# Patient Record
Sex: Female | Born: 1975 | Race: White | Hispanic: No | Smoking: Former smoker
Health system: Southern US, Community
[De-identification: ages and names within clinical notes are randomized; demographics above are authoritative.]

## PROBLEM LIST (undated history)

## (undated) DIAGNOSIS — G43909 Migraine, unspecified, not intractable, without status migrainosus: Secondary | ICD-10-CM

## (undated) HISTORY — PX: NO PAST SURGERIES: SHX2092

## (undated) HISTORY — DX: Migraine, unspecified, not intractable, without status migrainosus: G43.909

---

## 2009-02-20 ENCOUNTER — Emergency Department (HOSPITAL_COMMUNITY): Admission: EM | Admit: 2009-02-20 | Discharge: 2009-02-20 | Payer: Self-pay | Admitting: Emergency Medicine

## 2009-09-23 ENCOUNTER — Ambulatory Visit (HOSPITAL_COMMUNITY): Admission: RE | Admit: 2009-09-23 | Discharge: 2009-09-23 | Payer: Self-pay | Admitting: Obstetrics

## 2010-04-07 ENCOUNTER — Ambulatory Visit: Payer: Self-pay | Admitting: *Deleted

## 2010-04-21 ENCOUNTER — Ambulatory Visit: Payer: Self-pay | Admitting: *Deleted

## 2011-03-08 LAB — CBC
HCT: 42.1 % (ref 36.0–46.0)
MCHC: 33.8 g/dL (ref 30.0–36.0)
MCV: 94.3 fL (ref 78.0–100.0)
Platelets: 236 10*3/uL (ref 150–400)
WBC: 7.5 10*3/uL (ref 4.0–10.5)

## 2011-03-15 LAB — CBC
HCT: 42.6 % (ref 36.0–46.0)
Platelets: 211 10*3/uL (ref 150–400)
RDW: 12.9 % (ref 11.5–15.5)

## 2011-03-15 LAB — COMPREHENSIVE METABOLIC PANEL
AST: 18 U/L (ref 0–37)
Albumin: 3.7 g/dL (ref 3.5–5.2)
Alkaline Phosphatase: 55 U/L (ref 39–117)
BUN: 2 mg/dL — ABNORMAL LOW (ref 6–23)
Creatinine, Ser: 0.48 mg/dL (ref 0.4–1.2)
GFR calc Af Amer: >60 mL/min (ref >60)
Potassium: 3.6 mEq/L (ref 3.5–5.1)
Total Protein: 5.9 g/dL — ABNORMAL LOW (ref 6.0–8.3)

## 2011-03-15 LAB — DIFFERENTIAL
Lymphocytes Relative: 22 % (ref 12–46)
Monocytes Absolute: 0.7 10*3/uL (ref 0.1–1.0)
Monocytes Relative: 7 % (ref 3–12)
Neutro Abs: 6.4 10*3/uL (ref 1.7–7.7)

## 2011-03-15 LAB — APTT: aPTT: 30 seconds (ref 24–37)

## 2011-08-13 ENCOUNTER — Ambulatory Visit: Payer: Self-pay | Admitting: Obstetrics

## 2013-03-10 ENCOUNTER — Telehealth: Payer: Self-pay | Admitting: *Deleted

## 2013-03-10 NOTE — Telephone Encounter (Signed)
Per Dr Clearance Coots- ok to RF Ambien 10mg   #30 1 po hs- with 2 additional RF. Rx called to pharmacy.

## 2013-03-10 NOTE — Telephone Encounter (Signed)
Call from the [facility] [phone]- patient is requesting a refill on her Ambien 10 mg # 30 last refilled 02/16/13.

## 2013-04-02 ENCOUNTER — Encounter: Payer: Self-pay | Admitting: Obstetrics

## 2013-04-02 ENCOUNTER — Ambulatory Visit (INDEPENDENT_AMBULATORY_CARE_PROVIDER_SITE_OTHER): Payer: Managed Care, Other (non HMO) | Admitting: Obstetrics

## 2013-04-02 VITALS — BP 105/66 | HR 84 | Temp 97.7°F | Wt 122.0 lb

## 2013-04-02 DIAGNOSIS — Z30431 Encounter for routine checking of intrauterine contraceptive device: Secondary | ICD-10-CM

## 2013-04-02 DIAGNOSIS — G4700 Insomnia, unspecified: Secondary | ICD-10-CM

## 2013-04-02 MED ORDER — ZOLPIDEM TARTRATE 10 MG PO TABS: 5.0000 mg | ORAL_TABLET | Freq: Every evening | ORAL | Status: AC | PRN

## 2013-04-02 MED ORDER — ZOLPIDEM TARTRATE 10 MG PO TABS: 5.0000 mg | ORAL_TABLET | Freq: Every evening | ORAL | Status: DC | PRN

## 2013-04-02 NOTE — Progress Notes (Signed)
Subjective:     Danielle Fitzgerald is a 37 y.o. female here for IUD follow up.  Current complaints: none.  Personal health questionnaire reviewed: not asked.   Gynecologic History No LMP recorded. Patient is not currently having periods (Reason: IUD). Contraception: IUD    The following portions of the patient's history were reviewed and updated as appropriate: allergies, current medications, past family history, past medical history, past social history, past surgical history and problem list.  Review of Systems Pertinent items are noted in HPI.    Objective:    No exam performed today, Consult only..    Assessment:    IUD Surveillance   Plan:    Education reviewed: IUD.

## 2013-04-02 NOTE — Patient Instructions (Signed)
IUD management

## 2013-04-23 ENCOUNTER — Ambulatory Visit: Payer: Self-pay | Admitting: Obstetrics

## 2015-03-15 ENCOUNTER — Other Ambulatory Visit: Payer: Self-pay | Admitting: Occupational Medicine

## 2015-03-15 ENCOUNTER — Ambulatory Visit: Payer: Self-pay

## 2015-03-15 ENCOUNTER — Ambulatory Visit: Payer: Self-pay | Admitting: Occupational Medicine

## 2015-03-15 DIAGNOSIS — M25531 Pain in right wrist: Secondary | ICD-10-CM

## 2015-03-15 DIAGNOSIS — M79641 Pain in right hand: Secondary | ICD-10-CM

## 2015-05-27 ENCOUNTER — Other Ambulatory Visit (HOSPITAL_COMMUNITY): Payer: Self-pay | Admitting: Orthopedic Surgery

## 2015-05-31 ENCOUNTER — Other Ambulatory Visit (HOSPITAL_COMMUNITY): Payer: Self-pay | Admitting: Orthopedic Surgery

## 2015-05-31 DIAGNOSIS — M25531 Pain in right wrist: Secondary | ICD-10-CM

## 2015-06-09 ENCOUNTER — Encounter (HOSPITAL_COMMUNITY)
Admission: RE | Admit: 2015-06-09 | Discharge: 2015-06-09 | Disposition: A | Discharge status: Home / Self Care | Payer: Worker's Compensation | Source: Ambulatory Visit | Attending: Orthopedic Surgery | Admitting: Orthopedic Surgery

## 2015-06-09 ENCOUNTER — Ambulatory Visit (HOSPITAL_COMMUNITY)
Admission: RE | Admit: 2015-06-09 | Discharge: 2015-06-09 | Disposition: A | Discharge status: Home / Self Care | Payer: Self-pay | Source: Ambulatory Visit | Attending: Orthopedic Surgery | Admitting: Orthopedic Surgery

## 2015-06-09 DIAGNOSIS — M25531 Pain in right wrist: Secondary | ICD-10-CM | POA: Insufficient documentation

## 2015-06-09 MED ORDER — TECHNETIUM TC 99M MEDRONATE IV KIT
25.0000 | PACK | Freq: Once | INTRAVENOUS | Status: AC | PRN
Start: 2015-06-09 — End: 2015-06-09
  Administered 2015-06-09: 27 via INTRAVENOUS

## 2015-06-14 ENCOUNTER — Ambulatory Visit (INDEPENDENT_AMBULATORY_CARE_PROVIDER_SITE_OTHER): Payer: BLUE CROSS/BLUE SHIELD | Admitting: Obstetrics

## 2015-06-14 ENCOUNTER — Encounter: Payer: Self-pay | Admitting: Obstetrics

## 2015-06-14 VITALS — BP 109/69 | HR 87 | Temp 97.0°F | Ht 65.0 in | Wt 136.8 lb

## 2015-06-14 DIAGNOSIS — Z124 Encounter for screening for malignant neoplasm of cervix: Secondary | ICD-10-CM

## 2015-06-14 DIAGNOSIS — Z30430 Encounter for insertion of intrauterine contraceptive device: Secondary | ICD-10-CM

## 2015-06-14 DIAGNOSIS — Z01419 Encounter for gynecological examination (general) (routine) without abnormal findings: Secondary | ICD-10-CM

## 2015-06-14 NOTE — Addendum Note (Signed)
Addended by: Clearnce HastenJOHNSON, LATIA on: 06/14/2015 02:32 PM   Modules accepted: Medications

## 2015-06-14 NOTE — Progress Notes (Signed)
Subjective:        Danielle Fitzgerald is a 39 y.o. female here for a routine exam.  Current complaints: none.    Personal health questionnaire:  Is patient Danielle Fitzgerald, have a family history of breast and/or ovarian cancer: no Is there a family history of uterine cancer diagnosed at age < 1550, gastrointestinal cancer, urinary tract cancer, family member who is a Personnel officerLynch syndrome-associated carrier: no Is the patient overweight and hypertensive, family history of diabetes, personal history of gestational diabetes, preeclampsia or PCOS: no Is patient over 1455, have PCOS,  family history of premature CHD under age 165, diabetes, smoke, have hypertension or peripheral artery disease:  no At any time, has a partner hit, kicked or otherwise hurt or frightened you?: no Over the past 2 weeks, have you felt down, depressed or hopeless?: no Over the past 2 weeks, have you felt little interest or pleasure in doing things?:no   Gynecologic History Patient's last menstrual period was 06/08/2015 (approximate). Contraception: IUD Last Pap: 2015. Results were: normal Last mammogram: n/a. Results were: n/a  Obstetric History OB History  No data available    Past Medical History  Diagnosis Date  . Migraine     Past Surgical History  Procedure Laterality Date  . No past surgeries       Current outpatient prescriptions:  .  levonorgestrel (MIRENA) 20 MCG/24HR IUD, 1 each by Intrauterine route once., Disp: , Rfl:  .  topiramate (TOPAMAX) 100 MG tablet, Take 100 mg by mouth 2 (two) times daily., Disp: , Rfl:  .  topiramate (TOPAMAX) 25 MG tablet, Take 25 mg by mouth every morning., Disp: , Rfl:  No Known Allergies  History  Substance Use Topics  . Smoking status: Current Every Day Smoker -- 0.25 packs/day    Types: Cigarettes  . Smokeless tobacco: Former NeurosurgeonUser  . Alcohol Use: No    History reviewed. No pertinent family history.    Review of Systems  Constitutional: negative for  fatigue and weight loss Respiratory: negative for cough and wheezing Cardiovascular: negative for chest pain, fatigue and palpitations Gastrointestinal: negative for abdominal pain and change in bowel habits Musculoskeletal:negative for myalgias Neurological: negative for gait problems and tremors Behavioral/Psych: negative for abusive relationship, depression Endocrine: negative for temperature intolerance   Genitourinary:negative for abnormal menstrual periods, genital lesions, hot flashes, sexual problems and vaginal discharge Integument/breast: negative for breast lump, breast tenderness, nipple discharge and skin lesion(s)    Objective:       BP 109/69 mmHg  Pulse 87  Temp(Src) 97 F (36.1 C)  Ht 5\' 5"  (1.651 m)  Wt 136 lb 12.8 oz (62.052 kg)  BMI 22.76 kg/m2  LMP 06/08/2015 (Approximate) General:   alert  Skin:   no rash or abnormalities  Lungs:   clear to auscultation bilaterally  Heart:   regular rate and rhythm, S1, S2 normal, no murmur, click, rub or gallop  Breasts:   normal without suspicious masses, skin or nipple changes or axillary nodes  Abdomen:  normal findings: no organomegaly, soft, non-tender and no hernia  Pelvis:  External genitalia: normal general appearance Urinary system: urethral meatus normal and bladder without fullness, nontender Vaginal: normal without tenderness, induration or masses Cervix: normal appearance.  IUD string visible, normal length. Adnexa: normal bimanual exam Uterus: anteverted and non-tender, normal size   Lab Review Urine pregnancy test Labs reviewed yes Radiologic studies reviewed no    Assessment:    Healthy female exam.    Contraceptive  surveillance.  Pleased with IUD.   Plan:    Education reviewed: calcium supplements, depression evaluation, low fat, low cholesterol diet, safe sex/STD prevention, self breast exams, smoking cessation and weight bearing exercise. Contraception: IUD. Follow up in: 1 year.   No  orders of the defined types were placed in this encounter.   Orders Placed This Encounter  Procedures  . SureSwab, Vaginosis/Vaginitis Plus

## 2015-06-16 LAB — PAP IG AND HPV HIGH-RISK: HPV DNA High Risk: NOT DETECTED

## 2015-06-17 LAB — SURESWAB, VAGINOSIS/VAGINITIS PLUS
ATOPOBIUM VAGINAE: 6.6 Log (cells/mL)
C. GLABRATA, DNA: NOT DETECTED
C. PARAPSILOSIS, DNA: NOT DETECTED
C. albicans, DNA: NOT DETECTED
C. trachomatis RNA, TMA: NOT DETECTED
C. tropicalis, DNA: NOT DETECTED
GARDNERELLA VAGINALIS: 7.7 Log (cells/mL)
LACTOBACILLUS SPECIES: NOT DETECTED Log (cells/mL)
MEGASPHAERA SPECIES: 7.8 Log (cells/mL)
N. GONORRHOEAE RNA, TMA: NOT DETECTED
T. vaginalis RNA, QL TMA: NOT DETECTED

## 2015-06-21 ENCOUNTER — Other Ambulatory Visit: Payer: Self-pay | Admitting: Obstetrics

## 2015-06-21 DIAGNOSIS — N760 Acute vaginitis: Principal | ICD-10-CM

## 2015-06-21 DIAGNOSIS — B9689 Other specified bacterial agents as the cause of diseases classified elsewhere: Secondary | ICD-10-CM

## 2015-06-21 MED ORDER — TINIDAZOLE 500 MG PO TABS: 1000.0000 mg | ORAL_TABLET | Freq: Every day | ORAL | Status: DC

## 2016-03-28 DIAGNOSIS — G43011 Migraine without aura, intractable, with status migrainosus: Secondary | ICD-10-CM | POA: Insufficient documentation

## 2016-03-28 DIAGNOSIS — R519 Headache, unspecified: Secondary | ICD-10-CM | POA: Insufficient documentation

## 2016-03-28 DIAGNOSIS — M791 Myalgia, unspecified site: Secondary | ICD-10-CM | POA: Insufficient documentation

## 2017-02-07 ENCOUNTER — Other Ambulatory Visit: Payer: Self-pay | Admitting: Family Medicine

## 2017-02-07 ENCOUNTER — Ambulatory Visit
Admission: RE | Admit: 2017-02-07 | Discharge: 2017-02-07 | Disposition: A | Discharge status: Home / Self Care | Payer: Worker's Compensation | Source: Ambulatory Visit | Attending: Family Medicine | Admitting: Family Medicine

## 2017-02-07 DIAGNOSIS — M79641 Pain in right hand: Secondary | ICD-10-CM

## 2020-06-07 ENCOUNTER — Other Ambulatory Visit: Payer: Self-pay

## 2020-06-07 ENCOUNTER — Ambulatory Visit (INDEPENDENT_AMBULATORY_CARE_PROVIDER_SITE_OTHER): Payer: 59 | Admitting: Obstetrics

## 2020-06-07 ENCOUNTER — Other Ambulatory Visit (HOSPITAL_COMMUNITY)
Admission: RE | Admit: 2020-06-07 | Discharge: 2020-06-07 | Disposition: A | Discharge status: Home / Self Care | Payer: 59 | Source: Ambulatory Visit | Attending: Obstetrics | Admitting: Obstetrics

## 2020-06-07 ENCOUNTER — Encounter: Payer: Self-pay | Admitting: Obstetrics

## 2020-06-07 VITALS — BP 112/69 | HR 83 | Ht 66.0 in | Wt 121.0 lb

## 2020-06-07 DIAGNOSIS — F17200 Nicotine dependence, unspecified, uncomplicated: Secondary | ICD-10-CM

## 2020-06-07 DIAGNOSIS — Z01419 Encounter for gynecological examination (general) (routine) without abnormal findings: Secondary | ICD-10-CM

## 2020-06-07 DIAGNOSIS — N6315 Unspecified lump in the right breast, overlapping quadrants: Secondary | ICD-10-CM

## 2020-06-07 DIAGNOSIS — N898 Other specified noninflammatory disorders of vagina: Secondary | ICD-10-CM

## 2020-06-07 DIAGNOSIS — Z1239 Encounter for other screening for malignant neoplasm of breast: Secondary | ICD-10-CM

## 2020-06-07 NOTE — Progress Notes (Signed)
  Pt has right side breast lump and right side pelvic bump.  Pt needs mammogram.  Pt has IUD in place- believes she has had for 7 years.  Would like to continue to use IUD for cycle control.

## 2020-06-07 NOTE — Progress Notes (Signed)
Subjective:        Danielle Fitzgerald is a 44 y.o. female here for a routine exam.  Current complaints: Lump in right breast.    Personal health questionnaire:  Is patient [name], have a family history of breast and/or ovarian cancer: no Is there a family history of uterine cancer diagnosed at age < 32, gastrointestinal cancer, urinary tract cancer, family member who is a Personnel officer syndrome-associated carrier: no Is the patient overweight and hypertensive, family history of diabetes, personal history of gestational diabetes, preeclampsia or PCOS: no Is patient over 45, have PCOS,  family history of premature CHD under age 99, diabetes, smoke, have hypertension or peripheral artery disease:  no At any time, has a partner hit, kicked or otherwise hurt or frightened you?: no Over the past 2 weeks, have you felt down, depressed or hopeless?: no Over the past 2 weeks, have you felt little interest or pleasure in doing things?:no   Gynecologic History No LMP recorded. (Menstrual status: IUD). Contraception: IUD Last Pap: 2016. Results were: normal Last mammogram: 2017. Results were: normal  Obstetric History OB History  No obstetric history on file.    Past Medical History:  Diagnosis Date   Migraine     Past Surgical History:  Procedure Laterality Date   NO PAST SURGERIES       Current Outpatient Medications:    naratriptan (AMERGE) 2.5 MG tablet, Take 2.5 mg by mouth as needed for migraine. Take one (1) tablet at onset of headache; if returns or does not resolve, may repeat after 4 hours; do not exceed five (5) mg in 24 hours., Disp: , Rfl:    zonisamide (ZONEGRAN) 25 MG capsule, Take 25 mg by mouth daily., Disp: , Rfl:    levonorgestrel (MIRENA) 20 MCG/24HR IUD, 1 each by Intrauterine route once., Disp: , Rfl:    meloxicam (MOBIC) 15 MG tablet, Take 15 mg by mouth daily., Disp: , Rfl:  No Known Allergies  Social History   Tobacco Use   Smoking status: Current  Every Day Smoker    Packs/day: 0.25    Types: Cigarettes   Smokeless tobacco: Former Neurosurgeon   Tobacco comment: 2 cigs/ day  Substance Use Topics   Alcohol use: No    History reviewed. No pertinent family history.    Review of Systems  Constitutional: negative for fatigue and weight loss Respiratory: negative for cough and wheezing Cardiovascular: negative for chest pain, fatigue and palpitations Gastrointestinal: negative for abdominal pain and change in bowel habits Musculoskeletal:negative for myalgias Neurological: negative for gait problems and tremors Behavioral/Psych: negative for abusive relationship, depression Endocrine: negative for temperature intolerance    Genitourinary:negative for abnormal menstrual periods, genital lesions, hot flashes, sexual problems and vaginal discharge Integument/breast: negative for breast lump, breast tenderness, nipple discharge and skin lesion(s)    Objective:       BP 112/69    Pulse 83    Ht 5\' 6"  (1.676 m)    Wt 121 lb (54.9 kg)    BMI 19.53 kg/m  General:   alert  Skin:   no rash or abnormalities  Lungs:   clear to auscultation bilaterally  Heart:   regular rate and rhythm, S1, S2 normal, no murmur, click, rub or gallop  Breasts:   normal without suspicious masses, skin or nipple changes or axillary nodes  Abdomen:  normal findings: no organomegaly, soft, non-tender and no hernia  Pelvis:  External genitalia: normal general appearance Urinary system: urethral meatus normal  and bladder without fullness, nontender Vaginal: normal without tenderness, induration or masses Cervix: normal appearance Adnexa: normal bimanual exam Uterus: anteverted and non-tender, normal size   Lab Review Urine pregnancy test Labs reviewed yes Radiologic studies reviewed yes  50% of 20 min visit spent on counseling and coordination of care.   Assessment:     1. Encounter for routine gynecological examination with Papanicolaou smear of  cervix Rx: - Cytology - PAP( [place])  2. Vaginal discharge Rx: - Cervicovaginal ancillary only( [place])  3. Breast lump on right side at 9 o'clock position Rx: - [facility] DIAG BREAST TOMO BILATERAL; Future  4. Screening breast examination Rx: - [facility] DIGITAL SCREENING BILATERAL; Future  5. Tobacco dependence - cessation with the aid of medication and behavioral modification recommended   Plan:    Education reviewed: calcium supplements, depression evaluation, low fat, low cholesterol diet, safe sex/STD prevention, self breast exams, smoking cessation and weight bearing exercise. Contraception: IUD. Mammogram ordered. Follow up in: 2 weeks.   No orders of the defined types were placed in this encounter.  Orders Placed This Encounter  Procedures   MM DIAG BREAST TOMO BILATERAL    Standing Status:   Future    Standing Expiration Date:   06/07/2021    Order Specific Question:   Reason for Exam (SYMPTOM  OR DIAGNOSIS REQUIRED)    Answer:   Breast mass - Left breast    Order Specific Question:   Is the patient pregnant?    Answer:   No    Order Specific Question:   Preferred imaging location?    Answer:   [facility]   MM DIGITAL SCREENING BILATERAL    Standing Status:   Future    Standing Expiration Date:   06/07/2021    Order Specific Question:   Reason for Exam (SYMPTOM  OR DIAGNOSIS REQUIRED)    Answer:   Screening.    Order Specific Question:   Is the patient pregnant?    Answer:   No    Order Specific Question:   Preferred imaging location?    Answer:   [facility]     Brock Bad, MD 06/07/2020 2:47 PM

## 2020-06-08 LAB — CYTOLOGY - PAP
Comment: NEGATIVE
Diagnosis: NEGATIVE
High risk HPV: NEGATIVE

## 2020-06-09 ENCOUNTER — Other Ambulatory Visit: Payer: Self-pay | Admitting: Obstetrics

## 2020-06-09 DIAGNOSIS — B9689 Other specified bacterial agents as the cause of diseases classified elsewhere: Secondary | ICD-10-CM

## 2020-06-09 LAB — CERVICOVAGINAL ANCILLARY ONLY
Bacterial Vaginitis (gardnerella): POSITIVE — AB
Candida Glabrata: NEGATIVE
Candida Vaginitis: NEGATIVE
Chlamydia: NEGATIVE
Comment: NEGATIVE
Comment: NEGATIVE
Comment: NEGATIVE
Comment: NEGATIVE
Comment: NEGATIVE
Comment: NORMAL
Neisseria Gonorrhea: NEGATIVE
Trichomonas: NEGATIVE

## 2020-06-09 MED ORDER — METRONIDAZOLE 500 MG PO TABS: 500.0000 mg | ORAL_TABLET | Freq: Two times a day (BID) | ORAL | 2 refills | Status: AC

## 2020-06-10 ENCOUNTER — Telehealth: Payer: Self-pay

## 2020-06-10 NOTE — Telephone Encounter (Signed)
Called pt to advise of results and rx, no answer, left vm

## 2020-06-13 ENCOUNTER — Other Ambulatory Visit: Payer: Self-pay | Admitting: Obstetrics

## 2020-06-13 DIAGNOSIS — N6315 Unspecified lump in the right breast, overlapping quadrants: Secondary | ICD-10-CM

## 2020-06-21 ENCOUNTER — Other Ambulatory Visit: Payer: Self-pay

## 2020-06-21 ENCOUNTER — Ambulatory Visit (INDEPENDENT_AMBULATORY_CARE_PROVIDER_SITE_OTHER): Payer: 59 | Admitting: Obstetrics

## 2020-06-21 ENCOUNTER — Encounter: Payer: Self-pay | Admitting: Obstetrics

## 2020-06-21 VITALS — BP 119/70 | HR 87 | Wt 119.0 lb

## 2020-06-21 DIAGNOSIS — F17200 Nicotine dependence, unspecified, uncomplicated: Secondary | ICD-10-CM | POA: Diagnosis not present

## 2020-06-21 DIAGNOSIS — Z3009 Encounter for other general counseling and advice on contraception: Secondary | ICD-10-CM

## 2020-06-21 DIAGNOSIS — G43711 Chronic migraine without aura, intractable, with status migrainosus: Secondary | ICD-10-CM

## 2020-06-21 LAB — POCT URINE PREGNANCY: Preg Test, Ur: NEGATIVE

## 2020-06-21 NOTE — Progress Notes (Signed)
Subjective:    Jonnae Fonseca is a 44 y.o. female who presents for contraception counseling. The patient has no complaints today. The patient is sexually active. Pertinent past medical history: current smoker and migraines.  The information documented in the HPI was reviewed and verified.  Menstrual History: OB History   No obstetric history on file.      Patient's last menstrual period was 06/06/2020.   There are no problems to display for this patient.  Past Medical History:  Diagnosis Date  . Migraine     Past Surgical History:  Procedure Laterality Date  . NO PAST SURGERIES       Current Outpatient Medications:  .  meloxicam (MOBIC) 15 MG tablet, Take 15 mg by mouth daily., Disp: , Rfl:  .  metroNIDAZOLE (FLAGYL) 500 MG tablet, Take 1 tablet (500 mg total) by mouth 2 (two) times daily., Disp: 14 tablet, Rfl: 2 .  naratriptan (AMERGE) 2.5 MG tablet, Take 2.5 mg by mouth as needed for migraine. Take one (1) tablet at onset of headache; if returns or does not resolve, may repeat after 4 hours; do not exceed five (5) mg in 24 hours., Disp: , Rfl:  .  zonisamide (ZONEGRAN) 25 MG capsule, Take 25 mg by mouth daily., Disp: , Rfl:  No Known Allergies  Social History   Tobacco Use  . Smoking status: Current Every Day Smoker    Packs/day: 0.25    Types: Cigarettes  . Smokeless tobacco: Former Neurosurgeon  . Tobacco comment: 2 cigs/ day  Substance Use Topics  . Alcohol use: No    No family history on file.     Review of Systems Constitutional: negative for weight loss Genitourinary:negative for abnormal menstrual periods and vaginal discharge   Objective:   BP 119/70   Pulse 87   Wt 119 lb (54 kg)   LMP 06/06/2020   BMI 19.21 kg/m   PE:  Deferred   Lab Review Urine pregnancy test Labs reviewed yes Radiologic studies reviewed yes  50% of 15 min visit spent on counseling and coordination of care.    Assessment:    44 y.o., discontinuing IUD, contraindications  tobacco dependence and migraines.   Plan:    All questions answered. Discussed healthy lifestyle modifications. Agricultural engineer distributed. Follow up as needed. Considering Endometrial Ablation.  Will remove IUD at the time of Ablation.  Orders Placed This Encounter  Procedures  . POCT urine pregnancy    Brock Bad, MD 06/21/2020 2:37 PM

## 2020-06-21 NOTE — Progress Notes (Signed)
RGYN pt present for IUD removal re-insertion. Pt states current IUD as been in 7 yrs pt has Mirena,

## 2020-07-01 ENCOUNTER — Other Ambulatory Visit: Payer: Self-pay

## 2020-07-01 ENCOUNTER — Other Ambulatory Visit: Payer: Self-pay | Admitting: Obstetrics

## 2020-07-01 ENCOUNTER — Ambulatory Visit
Admission: RE | Admit: 2020-07-01 | Discharge: 2020-07-01 | Disposition: A | Discharge status: Home / Self Care | Payer: 59 | Source: Ambulatory Visit | Attending: Obstetrics | Admitting: Obstetrics

## 2020-07-01 DIAGNOSIS — N6315 Unspecified lump in the right breast, overlapping quadrants: Secondary | ICD-10-CM

## 2020-07-07 ENCOUNTER — Other Ambulatory Visit: Payer: Self-pay

## 2020-07-07 ENCOUNTER — Encounter: Payer: Self-pay | Admitting: Obstetrics & Gynecology

## 2020-07-07 ENCOUNTER — Ambulatory Visit (INDEPENDENT_AMBULATORY_CARE_PROVIDER_SITE_OTHER): Payer: 59 | Admitting: Obstetrics & Gynecology

## 2020-07-07 ENCOUNTER — Institutional Professional Consult (permissible substitution): Payer: 59 | Admitting: Obstetrics & Gynecology

## 2020-07-07 VITALS — BP 119/77 | HR 74 | Wt 118.3 lb

## 2020-07-07 DIAGNOSIS — Z30433 Encounter for removal and reinsertion of intrauterine contraceptive device: Secondary | ICD-10-CM | POA: Diagnosis not present

## 2020-07-07 DIAGNOSIS — Z30430 Encounter for insertion of intrauterine contraceptive device: Secondary | ICD-10-CM

## 2020-07-07 MED ORDER — LEVONORGESTREL 20 MCG/24HR IU IUD: INTRAUTERINE_SYSTEM | Freq: Once | INTRAUTERINE | Status: AC

## 2020-07-07 NOTE — Progress Notes (Signed)
Pt is here for consult. Pt reports that she is considering an ablation due to having heavy menstrual cycles for years. Pt reports she currently has a Mirena that is due to be removed, she reports the Mirena IUD has been helping with the bleeding.     GYNECOLOGY OFFICE PROCEDURE NOTE  Danielle Fitzgerald is a 44 y.o. No obstetric history on file. here for  Mirena replacement No GYN concerns.  Last pap smear was on 06/21/20 and was normal.We discussed that the IUD is not contraindicated and she wishes to proceed   GYNECOLOGY OFFICE PROCEDURE NOTE  Danielle Fitzgerald is a 44 y.o. No obstetric history on file. here for  IUD removal. No GYN concerns.  IUD Removal  Patient identified, informed consent performed, consent signed.  Patient was in the dorsal lithotomy position, normal external genitalia was noted.  A speculum was placed in the patient's vagina, normal discharge was noted, no lesions. The cervix was visualized, no lesions, no abnormal discharge.  The strings of the IUD were grasped and pulled using ring forceps. The IUD was removed in its entirety. .  Patient tolerated the procedure well.    Patient will use Mirena for contraception  IUD Insertion Procedure Note Patient identified, informed consent performed, consent signed.   Discussed risks of irregular bleeding, cramping, infection, malpositioning or misplacement of the IUD outside the uterus which may require further procedure such as laparoscopy. Time out was performed.  Urine pregnancy test negative.  Speculum placed in the vagina.  Cervix visualized.  Cleaned with Betadine x 2.  Grasped anteriorly with a single tooth tenaculum.  Uterus sounded to 8 cm.  Mirena IUD placed per manufacturer's recommendations.  Strings trimmed to 3 cm. Tenaculum was removed, good hemostasis noted.  Patient tolerated procedure well.   Patient was given post-procedure instructions.  Patient was also asked to check IUD strings periodically and follow up in 4  weeks for IUD check.   Scheryl Darter, MD, FACOG Attending Obstetrician & Gynecologist, [facility] for [facility], [facility]

## 2020-07-07 NOTE — Patient Instructions (Signed)
Levonorgestrel intrauterine device (IUD) What is this medicine? LEVONORGESTREL IUD (LEE voe nor jes trel) is a contraceptive (birth control) device. The device is placed inside the uterus by a healthcare professional. It is used to prevent pregnancy. This device can also be used to treat heavy bleeding that occurs during your period. This medicine may be used for other purposes; ask your health care provider or pharmacist if you have questions. COMMON BRAND NAME(S): Kyleena, LILETTA, Mirena, Skyla What should I tell my health care provider before I take this medicine? They need to know if you have any of these conditions:  abnormal Pap smear  cancer of the breast, uterus, or cervix  diabetes  endometritis  genital or pelvic infection now or in the past  have more than one sexual partner or your partner has more than one partner  heart disease  history of an ectopic or tubal pregnancy  immune system problems  IUD in place  liver disease or tumor  problems with blood clots or take blood-thinners  seizures  use intravenous drugs  uterus of unusual shape  vaginal bleeding that has not been explained  an unusual or allergic reaction to levonorgestrel, other hormones, silicone, or polyethylene, medicines, foods, dyes, or preservatives  pregnant or trying to get pregnant  breast-feeding How should I use this medicine? This device is placed inside the uterus by a health care professional. Talk to your pediatrician regarding the use of this medicine in children. Special care may be needed. Overdosage: If you think you have taken too much of this medicine contact a poison control center or emergency room at once. NOTE: This medicine is only for you. Do not share this medicine with others. What if I miss a dose? This does not apply. Depending on the brand of device you have inserted, the device will need to be replaced every 3 to 6 years if you wish to continue using this type  of birth control. What may interact with this medicine? Do not take this medicine with any of the following medications:  amprenavir  bosentan  fosamprenavir This medicine may also interact with the following medications:  aprepitant  armodafinil  barbiturate medicines for inducing sleep or treating seizures  bexarotene  boceprevir  griseofulvin  medicines to treat seizures like carbamazepine, ethotoin, felbamate, oxcarbazepine, phenytoin, topiramate  modafinil  pioglitazone  rifabutin  rifampin  rifapentine  some medicines to treat HIV infection like atazanavir, efavirenz, indinavir, lopinavir, nelfinavir, tipranavir, ritonavir  St. John's wort  warfarin This list may not describe all possible interactions. Give your health care provider a list of all the medicines, herbs, non-prescription drugs, or dietary supplements you use. Also tell them if you smoke, drink alcohol, or use illegal drugs. Some items may interact with your medicine. What should I watch for while using this medicine? Visit your doctor or health care professional for regular check ups. See your doctor if you or your partner has sexual contact with others, becomes HIV positive, or gets a sexual transmitted disease. This product does not protect you against HIV infection (AIDS) or other sexually transmitted diseases. You can check the placement of the IUD yourself by reaching up to the top of your vagina with clean fingers to feel the threads. Do not pull on the threads. It is a good habit to check placement after each menstrual period. Call your doctor right away if you feel more of the IUD than just the threads or if you cannot feel the threads at   all. The IUD may come out by itself. You may become pregnant if the device comes out. If you notice that the IUD has come out use a backup birth control method like condoms and call your health care provider. Using tampons will not change the position of the  IUD and are okay to use during your period. This IUD can be safely scanned with magnetic resonance imaging (MRI) only under specific conditions. Before you have an MRI, tell your healthcare provider that you have an IUD in place, and which type of IUD you have in place. What side effects may I notice from receiving this medicine? Side effects that you should report to your doctor or health care professional as soon as possible:  allergic reactions like skin rash, itching or hives, swelling of the face, lips, or tongue  fever, flu-like symptoms  genital sores  high blood pressure  no menstrual period for 6 weeks during use  pain, swelling, warmth in the leg  pelvic pain or tenderness  severe or sudden headache  signs of pregnancy  stomach cramping  sudden shortness of breath  trouble with balance, talking, or walking  unusual vaginal bleeding, discharge  yellowing of the eyes or skin Side effects that usually do not require medical attention (report to your doctor or health care professional if they continue or are bothersome):  acne  breast pain  change in sex drive or performance  changes in weight  cramping, dizziness, or faintness while the device is being inserted  headache  irregular menstrual bleeding within first 3 to 6 months of use  nausea This list may not describe all possible side effects. Call your doctor for medical advice about side effects. You may report side effects to FDA at 1-800-FDA-1088. Where should I keep my medicine? This does not apply. NOTE: This sheet is a summary. It may not cover all possible information. If you have questions about this medicine, talk to your doctor, pharmacist, or health care provider.  2020 Elsevier/Gold Standard (2018-09-30 13:22:01)  

## 2020-08-22 ENCOUNTER — Ambulatory Visit (INDEPENDENT_AMBULATORY_CARE_PROVIDER_SITE_OTHER): Payer: 59 | Admitting: Obstetrics & Gynecology

## 2020-08-22 ENCOUNTER — Encounter: Payer: Self-pay | Admitting: Obstetrics & Gynecology

## 2020-08-22 ENCOUNTER — Other Ambulatory Visit: Payer: Self-pay

## 2020-08-22 VITALS — BP 121/78 | HR 82 | Wt 125.0 lb

## 2020-08-22 DIAGNOSIS — Z975 Presence of (intrauterine) contraceptive device: Secondary | ICD-10-CM | POA: Diagnosis not present

## 2020-08-22 NOTE — Progress Notes (Signed)
RGYN patient presents for IUD String check.  Mirena IUD Inserted: 07/07/20  CC: NONE   No LMP recorded. (Menstrual status: IUD). Only bled for one day last menses and she is very satified  Pelvic exam: normal external genitalia, vulva, vagina, cervix, uterus and adnexa. Sting is only palpable at the os o/w nl exam  IUD in place and doing well  RTC for routine gyn care  Adam Phenix, MD 08/22/2020

## 2020-08-22 NOTE — Patient Instructions (Signed)
Levonorgestrel intrauterine device (IUD) What is this medicine? LEVONORGESTREL IUD (LEE voe nor jes trel) is a contraceptive (birth control) device. The device is placed inside the uterus by a healthcare professional. It is used to prevent pregnancy. This device can also be used to treat heavy bleeding that occurs during your period. This medicine may be used for other purposes; ask your health care provider or pharmacist if you have questions. COMMON BRAND NAME(S): Kyleena, LILETTA, Mirena, Skyla What should I tell my health care provider before I take this medicine? They need to know if you have any of these conditions:  abnormal Pap smear  cancer of the breast, uterus, or cervix  diabetes  endometritis  genital or pelvic infection now or in the past  have more than one sexual partner or your partner has more than one partner  heart disease  history of an ectopic or tubal pregnancy  immune system problems  IUD in place  liver disease or tumor  problems with blood clots or take blood-thinners  seizures  use intravenous drugs  uterus of unusual shape  vaginal bleeding that has not been explained  an unusual or allergic reaction to levonorgestrel, other hormones, silicone, or polyethylene, medicines, foods, dyes, or preservatives  pregnant or trying to get pregnant  breast-feeding How should I use this medicine? This device is placed inside the uterus by a health care professional. Talk to your pediatrician regarding the use of this medicine in children. Special care may be needed. Overdosage: If you think you have taken too much of this medicine contact a poison control center or emergency room at once. NOTE: This medicine is only for you. Do not share this medicine with others. What if I miss a dose? This does not apply. Depending on the brand of device you have inserted, the device will need to be replaced every 3 to 6 years if you wish to continue using this type  of birth control. What may interact with this medicine? Do not take this medicine with any of the following medications:  amprenavir  bosentan  fosamprenavir This medicine may also interact with the following medications:  aprepitant  armodafinil  barbiturate medicines for inducing sleep or treating seizures  bexarotene  boceprevir  griseofulvin  medicines to treat seizures like carbamazepine, ethotoin, felbamate, oxcarbazepine, phenytoin, topiramate  modafinil  pioglitazone  rifabutin  rifampin  rifapentine  some medicines to treat HIV infection like atazanavir, efavirenz, indinavir, lopinavir, nelfinavir, tipranavir, ritonavir  St. John's wort  warfarin This list may not describe all possible interactions. Give your health care provider a list of all the medicines, herbs, non-prescription drugs, or dietary supplements you use. Also tell them if you smoke, drink alcohol, or use illegal drugs. Some items may interact with your medicine. What should I watch for while using this medicine? Visit your doctor or health care professional for regular check ups. See your doctor if you or your partner has sexual contact with others, becomes HIV positive, or gets a sexual transmitted disease. This product does not protect you against HIV infection (AIDS) or other sexually transmitted diseases. You can check the placement of the IUD yourself by reaching up to the top of your vagina with clean fingers to feel the threads. Do not pull on the threads. It is a good habit to check placement after each menstrual period. Call your doctor right away if you feel more of the IUD than just the threads or if you cannot feel the threads at   all. The IUD may come out by itself. You may become pregnant if the device comes out. If you notice that the IUD has come out use a backup birth control method like condoms and call your health care provider. Using tampons will not change the position of the  IUD and are okay to use during your period. This IUD can be safely scanned with magnetic resonance imaging (MRI) only under specific conditions. Before you have an MRI, tell your healthcare provider that you have an IUD in place, and which type of IUD you have in place. What side effects may I notice from receiving this medicine? Side effects that you should report to your doctor or health care professional as soon as possible:  allergic reactions like skin rash, itching or hives, swelling of the face, lips, or tongue  fever, flu-like symptoms  genital sores  high blood pressure  no menstrual period for 6 weeks during use  pain, swelling, warmth in the leg  pelvic pain or tenderness  severe or sudden headache  signs of pregnancy  stomach cramping  sudden shortness of breath  trouble with balance, talking, or walking  unusual vaginal bleeding, discharge  yellowing of the eyes or skin Side effects that usually do not require medical attention (report to your doctor or health care professional if they continue or are bothersome):  acne  breast pain  change in sex drive or performance  changes in weight  cramping, dizziness, or faintness while the device is being inserted  headache  irregular menstrual bleeding within first 3 to 6 months of use  nausea This list may not describe all possible side effects. Call your doctor for medical advice about side effects. You may report side effects to FDA at 1-800-FDA-1088. Where should I keep my medicine? This does not apply. NOTE: This sheet is a summary. It may not cover all possible information. If you have questions about this medicine, talk to your doctor, pharmacist, or health care provider.  2020 Elsevier/Gold Standard (2018-09-30 13:22:01)  

## 2020-10-17 ENCOUNTER — Other Ambulatory Visit: Payer: Self-pay | Admitting: Occupational Medicine

## 2020-10-17 ENCOUNTER — Other Ambulatory Visit: Payer: Self-pay

## 2020-10-17 ENCOUNTER — Ambulatory Visit: Payer: Self-pay

## 2020-10-17 DIAGNOSIS — M79674 Pain in right toe(s): Secondary | ICD-10-CM

## 2021-05-05 IMAGING — MG DIGITAL DIAGNOSTIC BILAT W/ TOMO W/ CAD
8 of 14 series · 8 of 40 positions shown · non-contrast
Comparison: Previous exam(s).

CLINICAL DATA: Lump palpated by the patient in the 9 o'clock
location of the RIGHT breast. Lump palpated by patient's boyfriend
in the UPPER-OUTER QUADRANT of the LEFT breast. Patient has lost 27
pounds in the last 6 months.

EXAM:
DIGITAL DIAGNOSTIC BILATERAL MAMMOGRAM WITH CAD AND TOMO
ULTRASOUND BILATERAL BREAST

[L MLO synth-2D (1 of 2)]
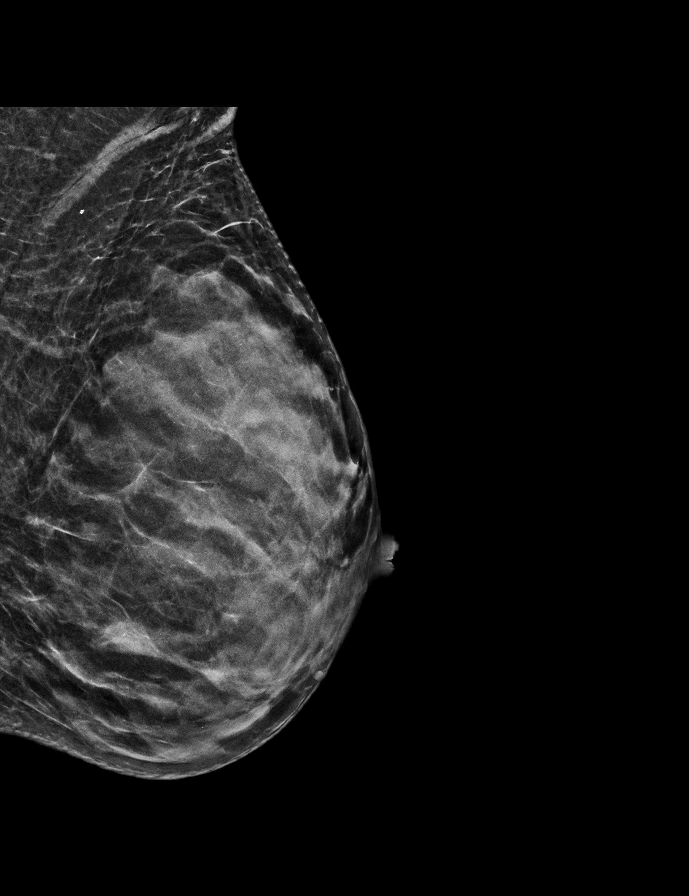

[R CC synth-2D (1 of 2)]
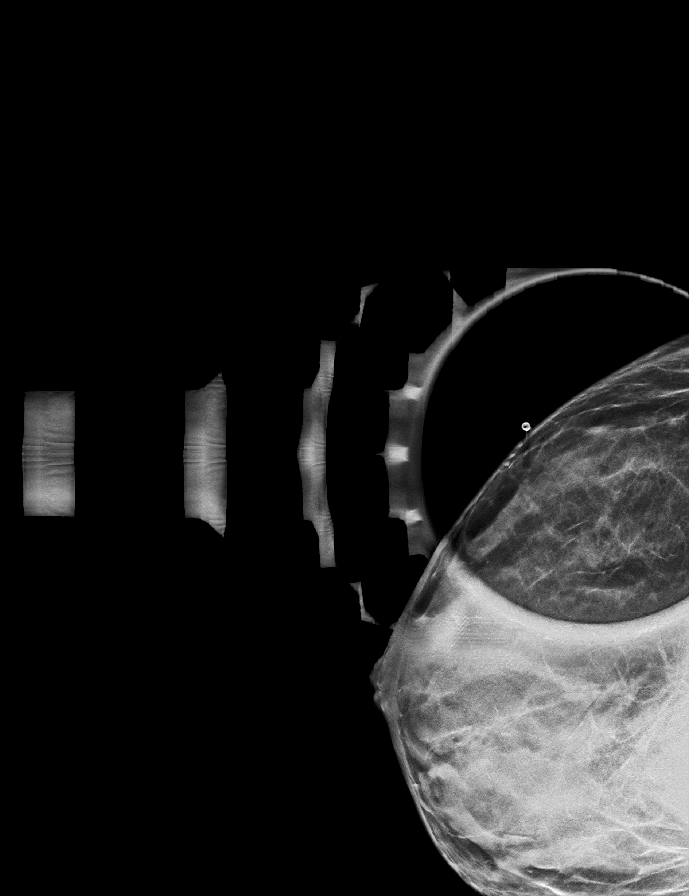

[R CC synth-2D (2 of 2)]
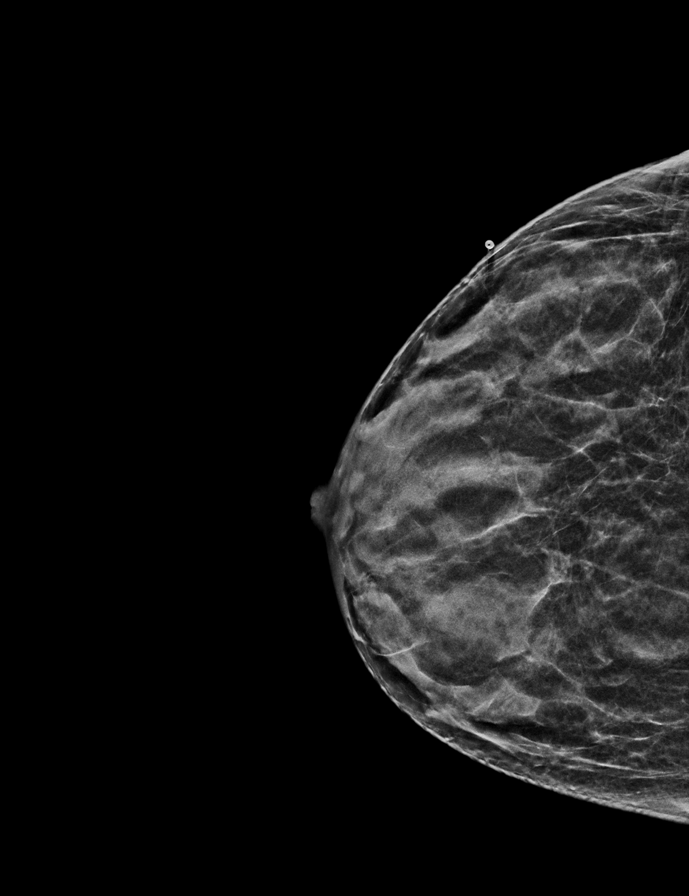

[R MLO synth-2D]
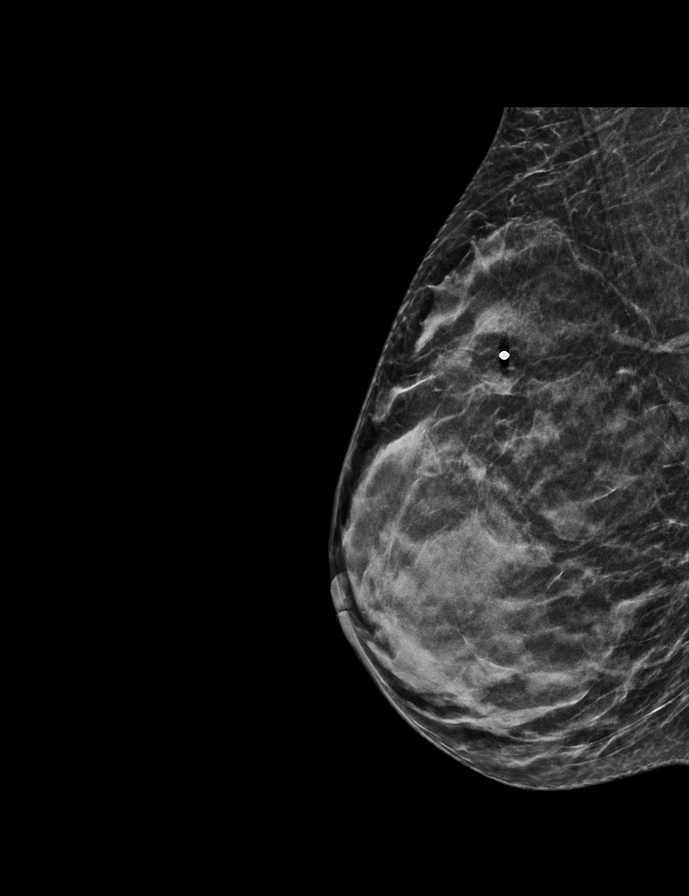

[L MLO synth-2D (2 of 2)]
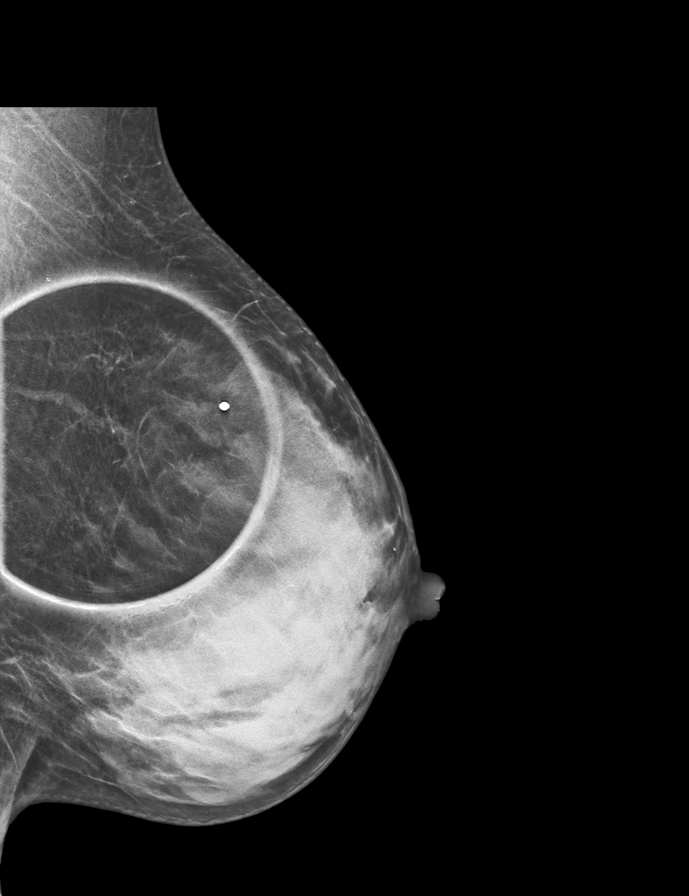

[L CC synth-2D (1 of 2)]
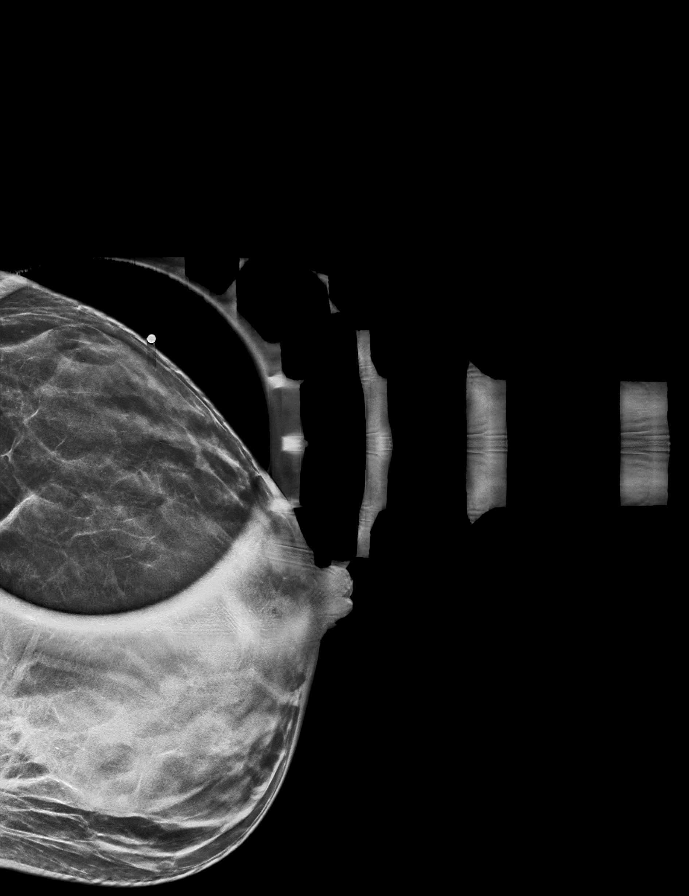

[L CC synth-2D (2 of 2)]
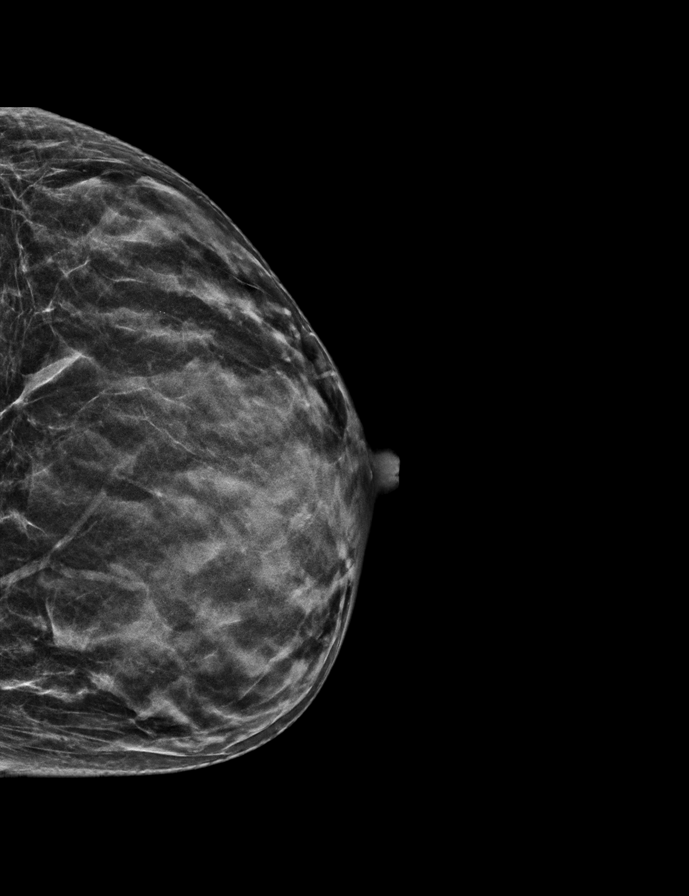

[R CC tomo · tomo slice 20/39.0]
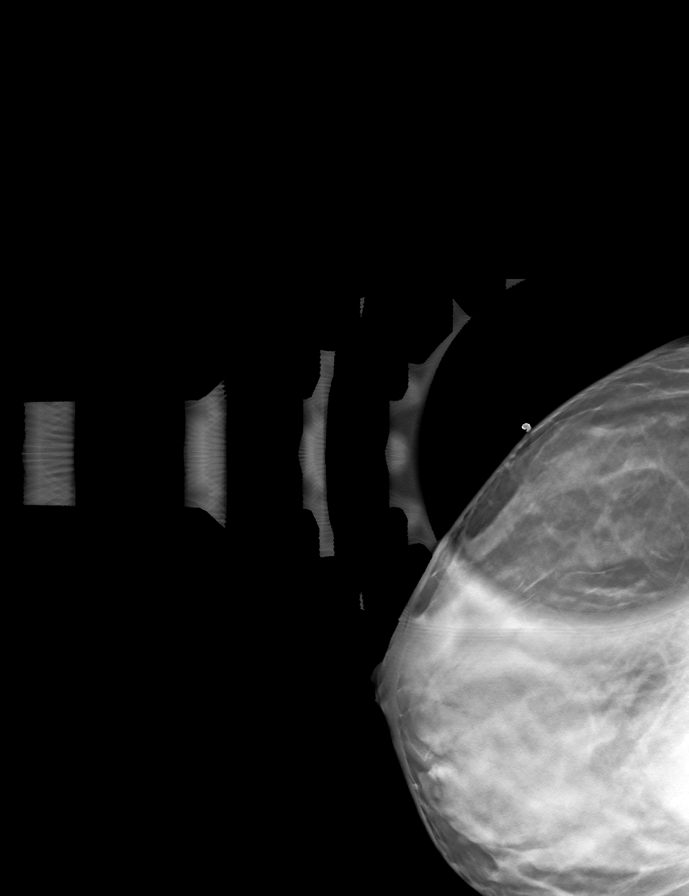

[8 of 40 positions shown; findings below may reference images not displayed]

ACR Breast Density Category d: The breast tissue is extremely dense,
which lowers the sensitivity of mammography.
FINDINGS: RIGHT BREAST:

Mammogram: No suspicious mass, distortion, or microcalcifications
are identified to suggest presence of malignancy. Spot tangential
view is performed in the area of patient's concern in the LATERAL
portion of the RIGHT breast, showing normal appearing fibroglandular
tissue. BB mammographic images were processed with CAD.

Physical Exam: I palpate soft not focal thickening in the
UPPER-OUTER QUADRANT of the RIGHT breast.

Ultrasound: Targeted ultrasound is performed, showing normal
appearing dense fibroglandular tissue in the UPPER-OUTER QUADRANT of
the RIGHT breast.

LEFT BREAST:

Mammogram: No suspicious mass, distortion, or microcalcifications
are identified to suggest presence of malignancy. Spot tangential
view is performed in the UPPER-OUTER QUADRANT LEFT breast, showing
normal appearing fibroglandular tissue. Spot compression view of the
superior portion of the LEFT breast shows dense fibroglandular
tissue without mass. Mammographic images were processed with CAD.

Physical Exam: I palpate soft nonfocal thickening in the UPPER-OUTER
QUADRANT of the LEFT breast.

Ultrasound: Targeted ultrasound is performed, showing normal
appearing dense fibroglandular tissue in the UPPER-OUTER QUADRANT of
the LEFT breast.
IMPRESSION: No mammographic or ultrasound evidence for malignancy.

RECOMMENDATION:
Screening mammogram in one year.(Code:EL-[id number])

I have discussed the findings and recommendations with the patient.
If applicable, a reminder letter will be sent to the patient
regarding the next appointment.

BI-RADS CATEGORY  2: Benign.

## 2021-05-05 IMAGING — US US BREAST*L* LIMITED INC AXILLA
1 series · 6 of 6 positions shown · non-contrast
Comparison: Previous exam(s).

CLINICAL DATA: Lump palpated by the patient in the 9 o'clock
location of the RIGHT breast. Lump palpated by patient's boyfriend
in the UPPER-OUTER QUADRANT of the LEFT breast. Patient has lost 27
pounds in the last 6 months.

EXAM:
DIGITAL DIAGNOSTIC BILATERAL MAMMOGRAM WITH CAD AND TOMO
ULTRASOUND BILATERAL BREAST

[Series 1: us breast*left* limited inc axilla · 0.06mm/px · 6 of 6 slices shown]
[im 1/6]
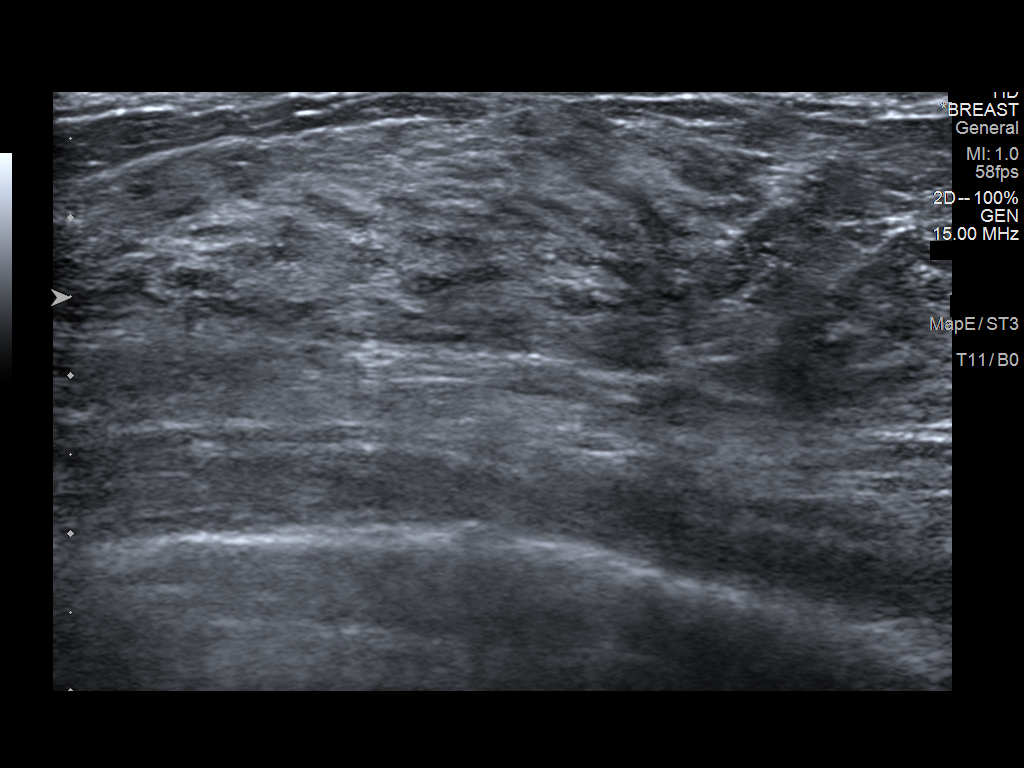
[im 2/6]
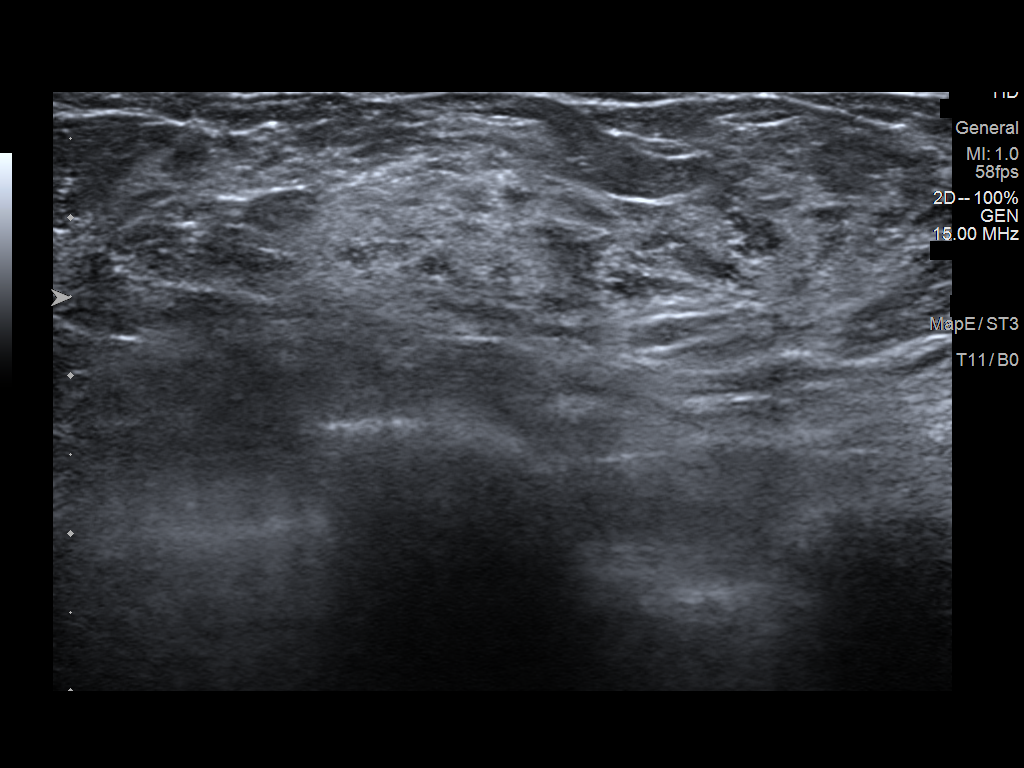
[im 3/6]
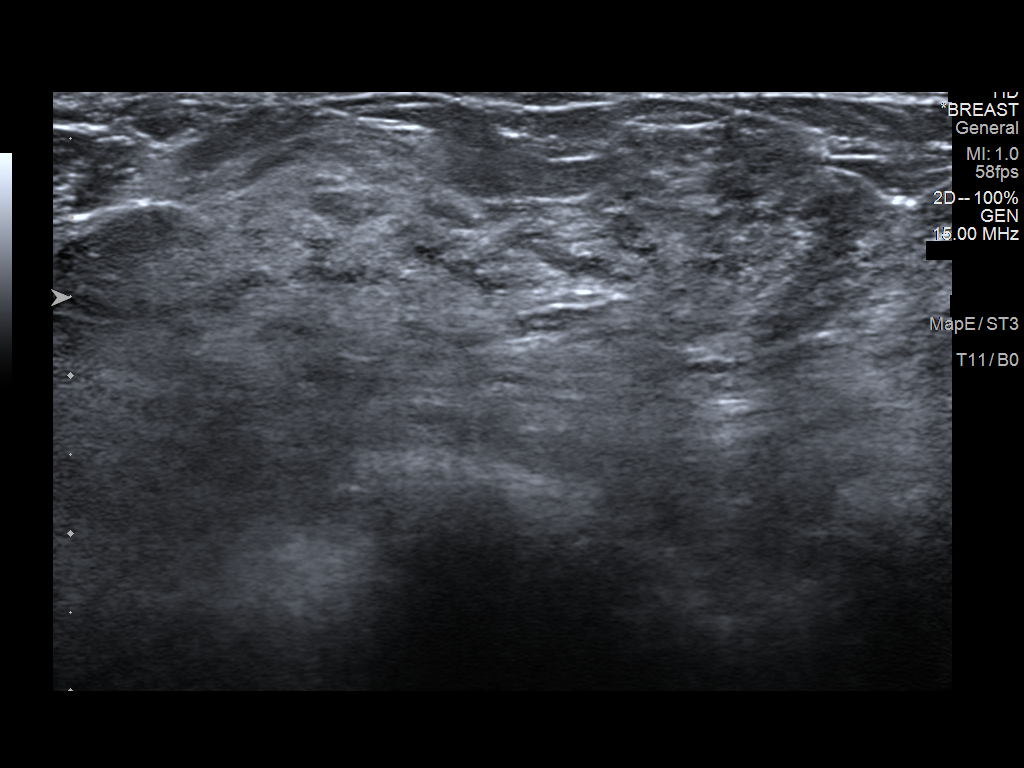
[im 4/6]
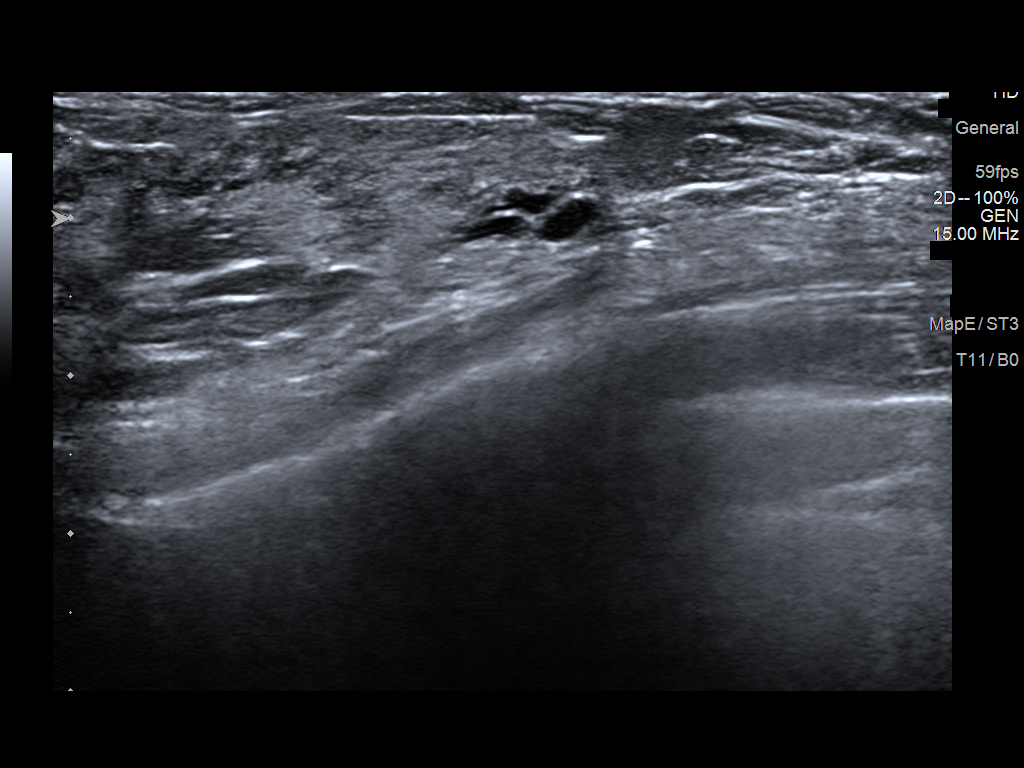
[im 5/6]
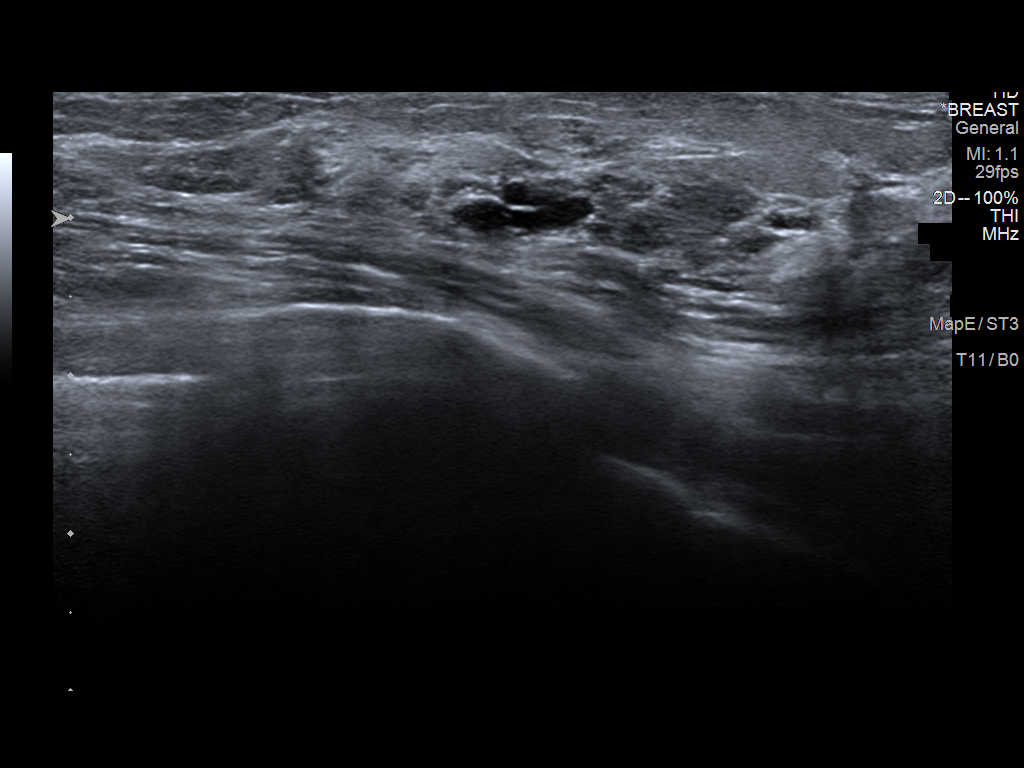
[im 6/6]
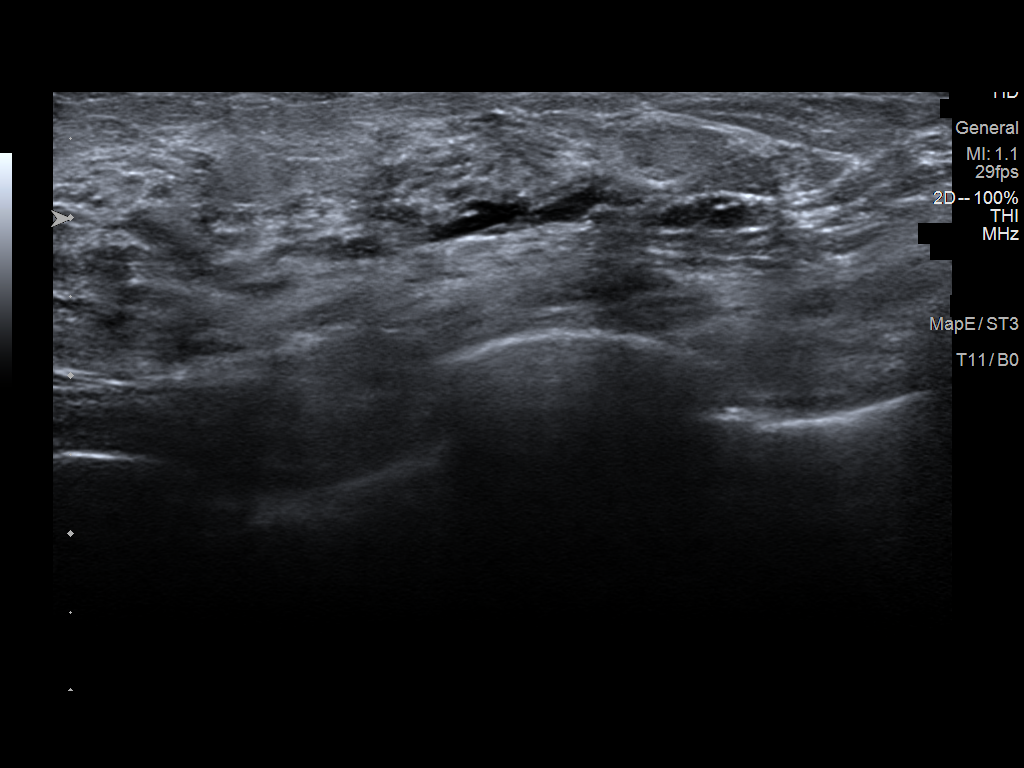

[6 of 6 positions shown; findings below may reference images not displayed]

ACR Breast Density Category d: The breast tissue is extremely dense,
which lowers the sensitivity of mammography.
FINDINGS: RIGHT BREAST:

Mammogram: No suspicious mass, distortion, or microcalcifications
are identified to suggest presence of malignancy. Spot tangential
view is performed in the area of patient's concern in the LATERAL
portion of the RIGHT breast, showing normal appearing fibroglandular
tissue. BB mammographic images were processed with CAD.

Physical Exam: I palpate soft not focal thickening in the
UPPER-OUTER QUADRANT of the RIGHT breast.

Ultrasound: Targeted ultrasound is performed, showing normal
appearing dense fibroglandular tissue in the UPPER-OUTER QUADRANT of
the RIGHT breast.

LEFT BREAST:

Mammogram: No suspicious mass, distortion, or microcalcifications
are identified to suggest presence of malignancy. Spot tangential
view is performed in the UPPER-OUTER QUADRANT LEFT breast, showing
normal appearing fibroglandular tissue. Spot compression view of the
superior portion of the LEFT breast shows dense fibroglandular
tissue without mass. Mammographic images were processed with CAD.

Physical Exam: I palpate soft nonfocal thickening in the UPPER-OUTER
QUADRANT of the LEFT breast.

Ultrasound: Targeted ultrasound is performed, showing normal
appearing dense fibroglandular tissue in the UPPER-OUTER QUADRANT of
the LEFT breast.
IMPRESSION: No mammographic or ultrasound evidence for malignancy.

RECOMMENDATION:
Screening mammogram in one year.(Code:EL-[id number])

I have discussed the findings and recommendations with the patient.
If applicable, a reminder letter will be sent to the patient
regarding the next appointment.

BI-RADS CATEGORY  2: Benign.

## 2024-03-19 ENCOUNTER — Ambulatory Visit: Admission: EM | Admit: 2024-03-19 | Discharge: 2024-03-19 | Disposition: A | Discharge status: Home / Self Care

## 2024-03-19 DIAGNOSIS — H65192 Other acute nonsuppurative otitis media, left ear: Secondary | ICD-10-CM

## 2024-03-19 MED ORDER — AMOXICILLIN 500 MG PO CAPS: 500.0000 mg | ORAL_CAPSULE | Freq: Three times a day (TID) | ORAL | 0 refills | Status: AC

## 2024-03-19 NOTE — ED Triage Notes (Signed)
"  My left ear is hurting especially when I swallow". No fever. "I also need a work note".

## 2024-03-22 ENCOUNTER — Encounter: Payer: Self-pay | Admitting: Physician Assistant

## 2024-03-22 NOTE — ED Provider Notes (Signed)
 [facility]    CSN: [id number] Arrival date & time: 03/19/24  1324      History   Chief Complaint Chief Complaint  Patient presents with   Otalgia    HPI Danielle Fitzgerald is a 48 y.o. female.   Patient here today for evaluation of left ear pain that started recently.  She reports pain is worse when she swallows.  She has not any fever but does need a work note.  The history is provided by the patient.  Otalgia Associated symptoms: no abdominal pain, no congestion, no cough, no diarrhea, no fever, no sore throat and no vomiting     Past Medical History:  Diagnosis Date   Migraine     Patient Active Problem List   Diagnosis Date Noted   Intractable migraine without aura and with status migrainosus 03/28/2016   Myalgia 03/28/2016   Right temporal headache 03/28/2016    Past Surgical History:  Procedure Laterality Date   NO PAST SURGERIES      OB History   No obstetric history on file.      Home Medications    Prior to Admission medications   Medication Sig Start Date End Date Taking? Authorizing Provider  amoxicillin  (AMOXIL ) 500 MG capsule Take 1 capsule (500 mg total) by mouth 3 (three) times daily. 03/19/24  Yes Vernestine Gondola, PA-C  levonorgestrel  (MIRENA ) 20 MCG/24HR IUD 1 each by Intrauterine route once.   Yes [provider]  topiramate (TOPAMAX) 50 MG tablet Take 50 mg by mouth 2 (two) times daily. 03/29/16  Yes [provider]  zonisamide (ZONEGRAN) 25 MG capsule Take 25 mg by mouth daily.   Yes [provider]  ibuprofen (ADVIL) 200 MG tablet Take 200 mg by mouth every 6 (six) hours as needed.    [provider]  meloxicam (MOBIC) 15 MG tablet Take 15 mg by mouth daily.    [provider]  metroNIDAZOLE  (FLAGYL ) 500 MG tablet Take 1 tablet (500 mg total) by mouth 2 (two) times daily. Patient not taking: Reported on 07/07/2020 06/09/20   Gabrielle Joiner, MD  naproxen sodium (ALEVE) 220 MG tablet  Take 220 mg by mouth 2 (two) times daily with a meal.    [provider]  naratriptan (AMERGE) 2.5 MG tablet Take 2.5 mg by mouth as needed for migraine. Take one (1) tablet at onset of headache; if returns or does not resolve, may repeat after 4 hours; do not exceed five (5) mg in 24 hours.    [provider]    Family History History reviewed. No pertinent family history.  Social History Social History   Tobacco Use   Smoking status: Former    Current packs/day: 0.25    Types: Cigarettes   Smokeless tobacco: Former   Tobacco comments:    2 cigs/ day  Vaping Use   Vaping status: Never Used  Substance Use Topics   Alcohol use: Yes    Comment: Occassionally.   Drug use: Never     Allergies   Patient has no known allergies.   Review of Systems Review of Systems  Constitutional:  Negative for chills and fever.  HENT:  Positive for ear pain. Negative for congestion and sore throat.   Eyes:  Negative for discharge and redness.  Respiratory:  Negative for cough, shortness of breath and wheezing.   Gastrointestinal:  Negative for abdominal pain, diarrhea, nausea and vomiting.     Physical Exam Triage Vital Signs  ED Triage Vitals  Encounter Vitals Group     BP 03/19/24 1403 114/73     Systolic BP Percentile --      Diastolic BP Percentile --      Pulse Rate 03/19/24 1403 83     Resp 03/19/24 1403 18     Temp 03/19/24 1403 98.1 F (36.7 C)     Temp Source 03/19/24 1403 Oral     SpO2 03/19/24 1403 99 %     Weight 03/19/24 1401 147 lb (66.7 kg)     Height 03/19/24 1401 5\' 6"  (1.676 m)     Head Circumference --      Peak Flow --      Pain Score 03/19/24 1359 9     Pain Loc --      Pain Education --      Exclude from Growth Chart --    No data found.  Updated Vital Signs BP 114/73 (BP Location: Left Arm)   Pulse 83   Temp 98.1 F (36.7 C) (Oral)   Resp 18   Ht 5\' 6"  (1.676 m)   Wt 147 lb (66.7 kg)   LMP  (LMP Unknown)   SpO2 99%   BMI  23.73 kg/m   Visual Acuity Right Eye Distance:   Left Eye Distance:   Bilateral Distance:    Right Eye Near:   Left Eye Near:    Bilateral Near:     Physical Exam Vitals and nursing note reviewed.  Constitutional:      General: She is not in acute distress.    Appearance: Normal appearance. She is not ill-appearing.  HENT:     Head: Normocephalic and atraumatic.     Right Ear: Tympanic membrane normal.     Ears:     Comments: Left TM erythematous    Nose: Nose normal. No congestion or rhinorrhea.     Mouth/Throat:     Mouth: Mucous membranes are moist.  Eyes:     Conjunctiva/sclera: Conjunctivae normal.  Cardiovascular:     Rate and Rhythm: Normal rate.  Pulmonary:     Effort: Pulmonary effort is normal.  Neurological:     Mental Status: She is alert.  Psychiatric:        Mood and Affect: Mood normal.        Behavior: Behavior normal.        Thought Content: Thought content normal.      UC Treatments / Results  Labs (all labs ordered are listed, but only abnormal results are displayed) Labs Reviewed - No data to display  EKG   Radiology No results found.  Procedures Procedures (including critical care time)  Medications Ordered in UC Medications - No data to display  Initial Impression / Assessment and Plan / UC Course  I have reviewed the triage vital signs and the nursing notes.  Pertinent labs & imaging results that were available during my care of the patient were reviewed by me and considered in my medical decision making (see chart for details).    Amoxicillin  prescribed to cover otitis media.  Recommended follow-up if no gradual improvement or with any further concerns.  Final Clinical Impressions(s) / UC Diagnoses   Final diagnoses:  Other acute nonsuppurative otitis media of left ear, recurrence not specified   Discharge Instructions   None    ED Prescriptions     Medication Sig Dispense Auth. Provider   amoxicillin  (AMOXIL ) 500  MG capsule Take 1 capsule (500 mg total)  by mouth 3 (three) times daily. 21 capsule Vernestine Gondola, PA-C      PDMP not reviewed this encounter.   Vernestine Gondola, PA-C 03/22/24 [phone]

## 2025-02-01 ENCOUNTER — Ambulatory Visit: Admitting: Family Medicine
